# Patient Record
Sex: Male | Born: 1965 | Race: White | Hispanic: No | Marital: Married | Smoking: Former smoker
Health system: Southern US, Community
[De-identification: ages and names within clinical notes are randomized; demographics above are authoritative.]

## PROBLEM LIST (undated history)

## (undated) DIAGNOSIS — F1910 Other psychoactive substance abuse, uncomplicated: Secondary | ICD-10-CM

## (undated) DIAGNOSIS — F1011 Alcohol abuse, in remission: Secondary | ICD-10-CM

## (undated) DIAGNOSIS — R21 Rash and other nonspecific skin eruption: Secondary | ICD-10-CM

## (undated) DIAGNOSIS — F411 Generalized anxiety disorder: Secondary | ICD-10-CM

## (undated) DIAGNOSIS — E785 Hyperlipidemia, unspecified: Secondary | ICD-10-CM

## (undated) DIAGNOSIS — J309 Allergic rhinitis, unspecified: Secondary | ICD-10-CM

## (undated) DIAGNOSIS — K5909 Other constipation: Secondary | ICD-10-CM

## (undated) HISTORY — DX: Rash and other nonspecific skin eruption: R21

## (undated) HISTORY — PX: KNEE SURGERY: SHX244

## (undated) HISTORY — DX: Hyperlipidemia, unspecified: E78.5

## (undated) HISTORY — DX: Generalized anxiety disorder: F41.1

## (undated) HISTORY — DX: Allergic rhinitis, unspecified: J30.9

## (undated) HISTORY — DX: Other constipation: K59.09

## (undated) HISTORY — DX: Alcohol abuse, in remission: F10.11

## (undated) HISTORY — DX: Other psychoactive substance abuse, uncomplicated: F19.10

---

## 2009-10-09 ENCOUNTER — Ambulatory Visit: Payer: Self-pay | Admitting: Internal Medicine

## 2009-10-09 DIAGNOSIS — J309 Allergic rhinitis, unspecified: Secondary | ICD-10-CM

## 2009-10-09 DIAGNOSIS — F411 Generalized anxiety disorder: Secondary | ICD-10-CM

## 2009-10-09 HISTORY — DX: Generalized anxiety disorder: F41.1

## 2009-10-09 HISTORY — DX: Allergic rhinitis, unspecified: J30.9

## 2009-10-09 LAB — CONVERTED CEMR LAB
BUN: 13 mg/dL (ref 6–23)
Basophils Absolute: 0 10*3/uL (ref 0.0–0.1)
Bilirubin Urine: NEGATIVE
Bilirubin, Direct: 0.1 mg/dL (ref 0.0–0.3)
Chloride: 105 meq/L (ref 96–112)
Cholesterol: 209 mg/dL — ABNORMAL HIGH (ref 0–200)
Creatinine, Ser: 0.8 mg/dL (ref 0.4–1.5)
Direct LDL: 148.6 mg/dL
Eosinophils Absolute: 0.1 10*3/uL (ref 0.0–0.7)
Eosinophils Relative: 1.5 % (ref 0.0–5.0)
GFR calc non Af Amer: 112.63 mL/min (ref >60)
Glucose, Bld: 89 mg/dL (ref 70–99)
HCT: 48.4 % (ref 39.0–52.0)
HDL: 42.6 mg/dL (ref >39.00)
Hemoglobin, Urine: NEGATIVE
Ketones, ur: NEGATIVE mg/dL
Lymphs Abs: 2.2 10*3/uL (ref 0.7–4.0)
MCV: 86.6 fL (ref 78.0–100.0)
Monocytes Absolute: 0.6 10*3/uL (ref 0.1–1.0)
Neutrophils Relative %: 53.4 % (ref 43.0–77.0)
Platelets: 228 10*3/uL (ref 150.0–400.0)
Potassium: 4.2 meq/L (ref 3.5–5.1)
RDW: 13.1 % (ref 11.5–14.6)
TSH: 2.29 microintl units/mL (ref 0.35–5.50)
Total Bilirubin: 0.5 mg/dL (ref 0.3–1.2)
Total CHOL/HDL Ratio: 5
Total Protein, Urine: NEGATIVE mg/dL
Triglycerides: 145 mg/dL (ref 0.0–149.0)
Urine Glucose: NEGATIVE mg/dL
Urobilinogen, UA: 0.2 (ref 0.0–1.0)
VLDL: 29 mg/dL (ref 0.0–40.0)
WBC: 6.3 10*3/uL (ref 4.5–10.5)

## 2010-04-30 ENCOUNTER — Ambulatory Visit: Payer: Self-pay | Admitting: Internal Medicine

## 2010-04-30 DIAGNOSIS — K5909 Other constipation: Secondary | ICD-10-CM

## 2010-04-30 DIAGNOSIS — R21 Rash and other nonspecific skin eruption: Secondary | ICD-10-CM

## 2010-04-30 DIAGNOSIS — E785 Hyperlipidemia, unspecified: Secondary | ICD-10-CM

## 2010-04-30 HISTORY — DX: Rash and other nonspecific skin eruption: R21

## 2010-04-30 HISTORY — DX: Hyperlipidemia, unspecified: E78.5

## 2010-04-30 HISTORY — DX: Other constipation: K59.09

## 2010-08-05 NOTE — Assessment & Plan Note (Signed)
Summary: right middle finger pain/constipation/cd   Vital Signs:  Patient profile:   45 year old male Height:      72 inches Weight:      201.38 pounds BMI:     27.41 O2 Sat:      96 % on Room air Temp:     97.5 degrees F oral Pulse rate:   97 / minute BP sitting:   112 / 80  (left arm) Cuff size:   large  Vitals Entered By: Zella Ball Ewing CMA Duncan Dull) (April 30, 2010 1:33 PM)  O2 Flow:  Room air CC: Right middle finger pain, constipation/RE   CC:  Right middle finger pain and constipation/RE.  History of Present Illness: here to f/u; overall doing well, but works with a pressure washer often , without hand protection,and has developed rash with minimal tender but some scaly and becoming more extensive in size to third finger right hand only;  no fever, red streaks, pain, but has been ongoing for over 3 months now.    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  No fever, wt loss, night sweats, loss of appetite or other constitutional symptoms   Problems Prior to Update: 1)  Constipation, Chronic  (ICD-564.09) 2)  Hyperlipidemia  (ICD-272.4) 3)  Rash-nonvesicular  (ICD-782.1) 4)  Preventive Health Care  (ICD-V70.0) 5)  Anxiety  (ICD-300.00) 6)  Allergic Rhinitis  (ICD-477.9)  Medications Prior to Update: 1)  None  Current Medications (verified): 1)  Fluocinonide 0.05 % Crea (Fluocinonide) .... 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   Head:  normocephalic and atraumatic.   Eyes:  vision grossly intact, pupils equal, and pupils round.   Ears:  R ear normal and L ear normal.   Nose:  no external deformity and no nasal discharge.   Mouth:  no gingival abnormalities and pharynx pink and moist.   Neck:  supple and no masses.   Lungs:  normal respiratory effort and normal breath sounds.   Heart:  normal rate and regular rhythm.   Abdomen:  soft, non-tender, and normal bowel sounds.   Extremities:  no edema, no erythema    Impression & Recommendations:  Problem # 1:  RASH-NONVESICULAR (ICD-782.1)  His updated medication list for this problem includes:    Fluocinonide 0.05 % Crea (Fluocinonide) ..... Use asd two times a day as needed treat as above, f/u  any worsening signs or symptoms   Problem # 2:  CONSTIPATION, CHRONIC (ICD-564.09) mild, ongoing without red flags adn exam benign; advised good lifestyle habits as he does, stool softner and/or as needed OTC laxative;  f/u any worsening s/s;  ? IBS type constipation  Problem # 3:  HYPERLIPIDEMIA (ICD-272.4)  Labs Reviewed: SGOT: 30 (10/09/2009)   SGPT: 43 (10/09/2009)   HDL:42.60 (10/09/2009)  Chol:209 (10/09/2009)  Trig:145.0 (10/09/2009) d/w pt - declines statin at this time;  Pt to continue diet efforts, good med tolerance; to check labs - goal LDL less than 100  Complete Medication List: 1)  Fluocinonide 0.05 % Crea (Fluocinonide) .... Use asd two times a day  as needed  Patient Instructions: 1)  please try the colace stool softner 100 mg twice per day as needed , or even OTC senakot per day 2)  please follow lower cholesterol diet 3)  Please take all new medications as prescribed - the cream 4)  Continue all previous medications as before this visit  5)  Please schedule a follow-up appointment in April 2012 with CPX labs Prescriptions: FLUOCINONIDE 0.05 % CREA (FLUOCINONIDE) use asd two times a day as needed  #1 large x 1   Entered and Authorized by:   Corwin Levins MD   Signed by:   Corwin Levins MD on 04/30/2010   Method used:   Print then Give to Patient   RxID:   [id number]    Orders Added: 1)  Est. Patient Level IV [62952]

## 2010-08-05 NOTE — Assessment & Plan Note (Signed)
Summary: NEW/ [employer] /[place]   #   Vital Signs:  Patient profile:   45 year old male Height:      72.5 inches Weight:      207 pounds BMI:     27.79 O2 Sat:      98 % on Room air Temp:     98 degrees F oral Pulse rate:   85 / minute BP sitting:   120 / 70  (left arm) Cuff size:   regular  Vitals Entered ByZella Ball Ewing (October 09, 2009 9:47 AM)  O2 Flow:  Room air  CC: New Pt, New [employer]/RE   CC:  New Pt and New [employer]/RE.  History of Present Illness: just got new insurance;  last saw MD lsat yr, and prior to that 10 yrs;   here with chronic recurring left ear itching for which he has seen  4 MD's in the past yr with mult tx including antihist and topical steriod but nothing helps - eventually saw allergist - had some allergy shots but had some reaction and quit the shots;  peak wt has been 270 in the past; walks 5 miles per day to help control the anxiety  Preventive Screening-Counseling & Management  Alcohol-Tobacco     Smoking Status: quit      Drug Use:  no.    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 several maternal with DM, HTN father  with ETOH grandfather and uncle died with kidney cancer - both at 54yo  Social History: Reviewed history and no changes required. 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         all otherwise negative per pt -    Physical Exam  General:  alert and overweight-appearing.   Head:  normocephalic and atraumatic.   Eyes:  vision grossly intact, pupils equal, and pupils round.   Ears:  R ear normal and L ear normal.   Nose:  no external deformity and no nasal discharge.   Mouth:  no gingival abnormalities and pharynx pink and moist.   Neck:  supple and no masses.   Lungs:  normal respiratory effort and normal breath sounds.   Heart:  normal rate and regular rhythm.   Abdomen:  soft, non-tender, and normal bowel sounds.  Msk:  no joint tenderness and no joint swelling.   Extremities:  no edema, no erythema  Neurologic:  cranial nerves II-XII intact and strength normal in all extremities.     Impression & Recommendations:  Problem # 1:  Preventive Health Care (ICD-V70.0)  Overall doing well, age appropriate education and counseling updated and referral for appropriate preventive services done unless declined, immunizations up to date or declined, diet counseling done if overweight, urged to quit smoking if smokes , most recent labs reviewed and current ordered if appropriate, ecg reviewed or declined (interpretation per ECG scanned in  the EMR if done); information regarding Medicare Prevention requirements given if appropriate   Orders: TLB-BMP (Basic Metabolic Panel-BMET) (80048-METABOL) TLB-CBC Platelet - w/Differential (85025-CBCD) TLB-Hepatic/Liver Function Pnl (80076-HEPATIC) TLB-Lipid Panel (80061-LIPID) TLB-TSH (Thyroid Stimulating Hormone) (84443-TSH) TLB-PSA (Prostate Specific Antigen) (84153-PSA) TLB-Udip ONLY (81003-UDIP)  Patient Instructions: 1)  Please go to the Lab in the basement for your blood and/or urine tests today 2)  Continue all previous medications as before this visit  3)  Please schedule a follow-up appointment in 1 year or sooner if needed   Immunization History:  Tetanus/Td Immunization History:    Tetanus/Td:  historical (09/14/2008)

## 2011-06-18 ENCOUNTER — Ambulatory Visit (INDEPENDENT_AMBULATORY_CARE_PROVIDER_SITE_OTHER): Payer: BC Managed Care – PPO | Admitting: Internal Medicine

## 2011-06-18 ENCOUNTER — Other Ambulatory Visit: Payer: Self-pay | Admitting: Internal Medicine

## 2011-06-18 ENCOUNTER — Other Ambulatory Visit (INDEPENDENT_AMBULATORY_CARE_PROVIDER_SITE_OTHER): Payer: BC Managed Care – PPO

## 2011-06-18 ENCOUNTER — Encounter: Payer: Self-pay | Admitting: Internal Medicine

## 2011-06-18 VITALS — BP 120/90 | HR 69 | Temp 97.8°F | Ht 72.0 in | Wt 206.0 lb

## 2011-06-18 DIAGNOSIS — J309 Allergic rhinitis, unspecified: Secondary | ICD-10-CM

## 2011-06-18 DIAGNOSIS — Z0000 Encounter for general adult medical examination without abnormal findings: Secondary | ICD-10-CM

## 2011-06-18 DIAGNOSIS — G4710 Hypersomnia, unspecified: Secondary | ICD-10-CM

## 2011-06-18 DIAGNOSIS — F411 Generalized anxiety disorder: Secondary | ICD-10-CM

## 2011-06-18 DIAGNOSIS — G4700 Insomnia, unspecified: Secondary | ICD-10-CM

## 2011-06-18 LAB — BASIC METABOLIC PANEL
Calcium: 9.5 mg/dL (ref 8.4–10.5)
Creatinine, Ser: 0.8 mg/dL (ref 0.4–1.5)
GFR: 108.6 mL/min (ref >60.00)

## 2011-06-18 LAB — CBC WITH DIFFERENTIAL/PLATELET
Basophils Absolute: 0 10*3/uL (ref 0.0–0.1)
Basophils Relative: 0.3 % (ref 0.0–3.0)
Eosinophils Absolute: 0.1 10*3/uL (ref 0.0–0.7)
Hemoglobin: 16.1 g/dL (ref 13.0–17.0)
Lymphocytes Relative: 27.7 % (ref 12.0–46.0)
Monocytes Relative: 6.6 % (ref 3.0–12.0)
Neutro Abs: 3.6 10*3/uL (ref 1.4–7.7)
Neutrophils Relative %: 64.4 % (ref 43.0–77.0)
RBC: 5.25 Mil/uL (ref 4.22–5.81)
RDW: 13.4 % (ref 11.5–14.6)

## 2011-06-18 LAB — LIPID PANEL
Cholesterol: 204 mg/dL — ABNORMAL HIGH (ref 0–200)
HDL: 43 mg/dL (ref >39.00)
Total CHOL/HDL Ratio: 5
Triglycerides: 94 mg/dL (ref 0.0–149.0)
VLDL: 18.8 mg/dL (ref 0.0–40.0)

## 2011-06-18 LAB — URINALYSIS, ROUTINE W REFLEX MICROSCOPIC
Hgb urine dipstick: NEGATIVE
Nitrite: NEGATIVE
Specific Gravity, Urine: 1.025 (ref 1.000–1.030)
Urine Glucose: NEGATIVE
Urobilinogen, UA: 0.2 (ref 0.0–1.0)

## 2011-06-18 LAB — PSA: PSA: 0.63 ng/mL (ref 0.10–4.00)

## 2011-06-18 LAB — HEPATIC FUNCTION PANEL
ALT: 28 U/L (ref 0–53)
Albumin: 4.2 g/dL (ref 3.5–5.2)
Total Protein: 7.1 g/dL (ref 6.0–8.3)

## 2011-06-18 MED ORDER — FLUTICASONE PROPIONATE 50 MCG/ACT NA SUSP: 2.0000 | Freq: Every day | NASAL | Status: AC

## 2011-06-18 MED ORDER — ZOLPIDEM TARTRATE 10 MG PO TABS: 10.0000 mg | ORAL_TABLET | Freq: Every evening | ORAL | Status: AC | PRN

## 2011-06-18 NOTE — Progress Notes (Signed)
  Subjective:    Patient ID: Jack Velez, male    DOB: May 19, 1966, 45 y.o.   MRN: [id number]  HPI  Here to f/u and wellness;  Does have several wks ongoing nasal allergy symptoms with clear congestion, itch and sneeze, without fever, pain, ST, cough or wheezing.  Seasonal change makes wore.  Has been "hooked" on afrin in the past.  Has HA sometimes on waking up.  Some sleepy during the day still despite overall wt loss from 286 to 206 since 2007;  Has signficant insomnia with getting to sleep, but also has signficant limb movements with sleep as well.  Pt denies chest pain, increased sob or doe, wheezing, orthopnea, PND, increased LE swelling, palpitations, dizziness or syncope.  Pt denies new neurological symptoms such as new headache, or facial or extremity weakness or numbness   Pt denies polydipsia, polyuria.   Pt denies fever,  night sweats, loss of appetite, or other constitutional symptoms.  Denies worsening depressive symptoms, suicidal ideation, or panic, though has ongoing anxiety. Pt states good ability with ADL's, low fall risk, home safety reviewed and adequate, no significant changes in hearing or vision, and occasionally active with exercise.  Past Medical History  Diagnosis Date  . ALLERGIC RHINITIS 10/09/2009  . ANXIETY 10/09/2009  . CONSTIPATION, CHRONIC 04/30/2010  . HYPERLIPIDEMIA 04/30/2010  . RASH-NONVESICULAR 04/30/2010  . History of alcohol abuse     none since 2001  . Polysubstance abuse     hx of-none since 2001   Past Surgical History  Procedure Date  . Knee surgery     s/p left knee at 45yo    reports that he has quit smoking. He does not have any smokeless tobacco history on file. He reports that he does not drink alcohol or use illicit drugs. family history includes Cancer in his other; Diabetes in his other; and Hypertension in his other. No Known Allergies No current outpatient prescriptions on file prior to visit.   Review of Systems Review of Systems    Constitutional: Negative for diaphoresis and unexpected weight change.  HENT: Negative for drooling and tinnitus.   Eyes: Negative for photophobia and visual disturbance.  Respiratory: Negative for choking and stridor.   Gastrointestinal: Negative for vomiting and blood in stool.  Genitourinary: Negative for hematuria and decreased urine volume.       Objective:   Physical Exam BP 120/90  Pulse 69  Temp(Src) 97.8 F (36.6 C) (Oral)  Ht 6' (1.829 m)  Wt 206 lb (93.441 kg)  BMI 27.94 kg/m2  SpO2 97% Physical Exam  VS noted Constitutional: Pt appears well-developed and well-nourished.  HENT: Head: Normocephalic.  Right Ear: External ear normal.  Left Ear: External ear normal.  Bilat tm's mild erythema.  Sinus nontender.  Pharynx mild erythema Eyes: Conjunctivae and EOM are normal. Pupils are equal, round, and reactive to light.  Neck: Normal range of motion. Neck supple.  Cardiovascular: Normal rate and regular rhythm.   Pulmonary/Chest: Effort normal and breath sounds normal.  Neurological: Pt is alert. No cranial nerve deficit.  Skin: Skin is warm. No erythema.  Psychiatric: Pt behavior is normal. Thought content normal. 1-2+ nervous    Assessment & Plan:

## 2011-06-18 NOTE — Patient Instructions (Signed)
Take all new medications as prescribed Continue all other medications as before You will be contacted regarding the referral for: overnight oximetry, and pulmonary Please go to LAB in the Basement for the blood and/or urine tests to be done today Please call the phone number [phone] (the [employer]Tree System) for results of testing in 2-3 days;  When calling, simply dial the number, and when prompted enter the MRN number above (the Medical Record Number) and the # key, then the message should start. Please return in 1 year for your yearly visit, or sooner if needed, with Lab testing done 3-5 days before

## 2011-06-19 LAB — LDL CHOLESTEROL, DIRECT: Direct LDL: 143.5 mg/dL

## 2011-06-21 ENCOUNTER — Encounter: Payer: Self-pay | Admitting: Internal Medicine

## 2011-06-21 NOTE — Assessment & Plan Note (Signed)

## 2011-06-21 NOTE — Assessment & Plan Note (Signed)
stable overall by hx and exam, most recent data reviewed with pt, and pt to continue medical treatment as before  Lab Results  Component Value Date   WBC 5.5 06/18/2011   HGB 16.1 06/18/2011   HCT 45.9 06/18/2011   PLT 215.0 06/18/2011   GLUCOSE 96 06/18/2011   CHOL 204* 06/18/2011   TRIG 94.0 06/18/2011   HDL 43.00 06/18/2011   LDLDIRECT 143.5 06/18/2011   ALT 28 06/18/2011   AST 24 06/18/2011   NA 141 06/18/2011   K 4.5 06/18/2011   CL 106 06/18/2011   CREATININE 0.8 06/18/2011   BUN 15 06/18/2011   CO2 29 06/18/2011   TSH 2.01 06/18/2011   PSA 0.63 06/18/2011

## 2011-06-21 NOTE — Assessment & Plan Note (Signed)
Mild to mod, for flonase asd,  to f/u any worsening symptoms or concerns  

## 2011-06-21 NOTE — Assessment & Plan Note (Signed)
stable overall by hx and exam, and pt to continue medical treatment as before, for ambien qhs prn

## 2011-06-21 NOTE — Assessment & Plan Note (Signed)
?   OSA vs PLMD vs other - for ONO per [facility], and refer pulm,  to f/u any worsening symptoms or concerns

## 2011-07-09 ENCOUNTER — Institutional Professional Consult (permissible substitution): Payer: BC Managed Care – PPO | Admitting: Pulmonary Disease

## 2013-09-29 ENCOUNTER — Other Ambulatory Visit: Payer: Self-pay | Admitting: Gastroenterology

## 2013-09-29 DIAGNOSIS — R1310 Dysphagia, unspecified: Secondary | ICD-10-CM

## 2013-10-02 ENCOUNTER — Ambulatory Visit
Admission: RE | Admit: 2013-10-02 | Discharge: 2013-10-02 | Disposition: A | Discharge status: Home / Self Care | Payer: 59 | Source: Ambulatory Visit | Attending: Gastroenterology | Admitting: Gastroenterology

## 2013-10-02 DIAGNOSIS — R1310 Dysphagia, unspecified: Secondary | ICD-10-CM

## 2014-12-18 IMAGING — RF DG ESOPHAGUS
11 of 19 series · 12 of 24 positions shown · non-contrast
Comparison: None.

CLINICAL DATA: Dysphagia

EXAM:
ESOPHOGRAM / BARIUM SWALLOW / BARIUM TABLET STUDY
TECHNIQUE: Combined double contrast and single contrast examination performed
using effervescent crystals, thick barium liquid, and thin barium
liquid. The patient was observed with fluoroscopy swallowing a 13mm
barium sulphate tablet.
FLUOROSCOPY TIME:  1 min, 48 seconds

[Series 1: run · 1 of 7 slices shown (1 of 11)]
[im 4/7]
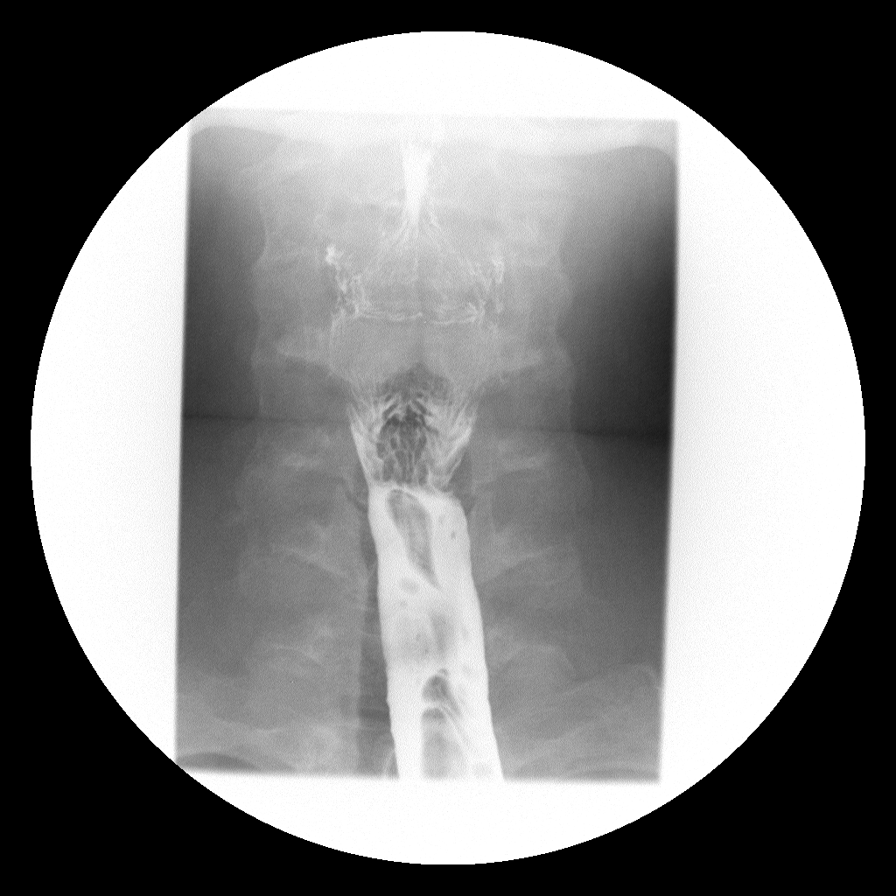

[Series 2: run · 2 of 7 slices shown (2 of 11)]
[im 1/7]
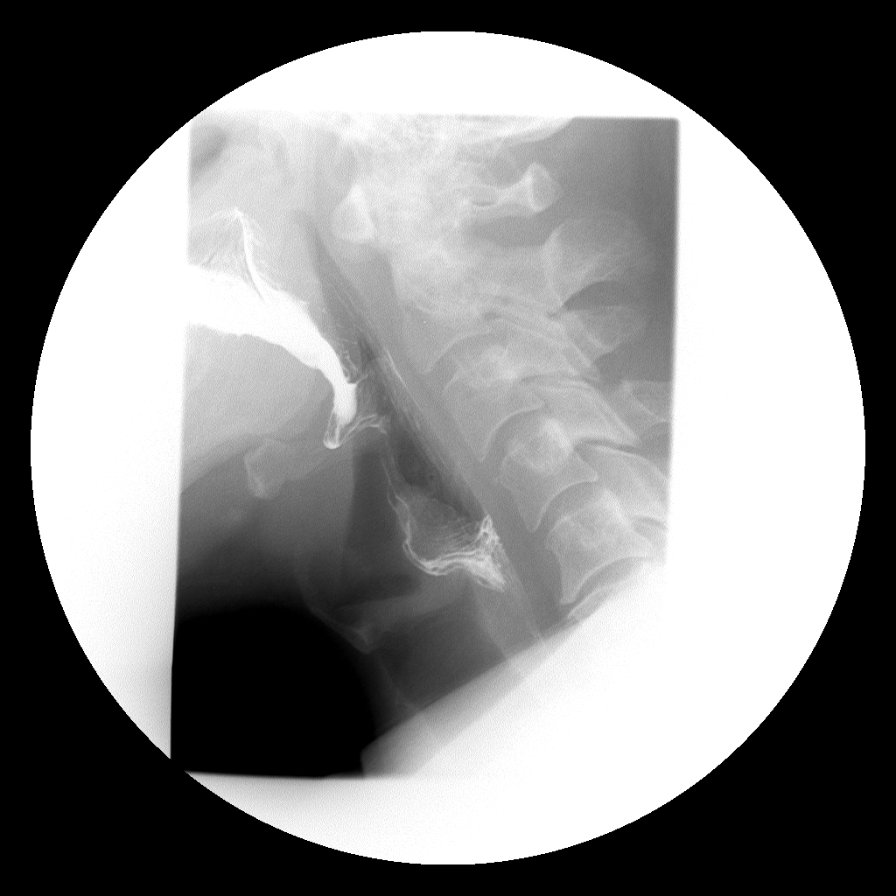
[im 5/7]
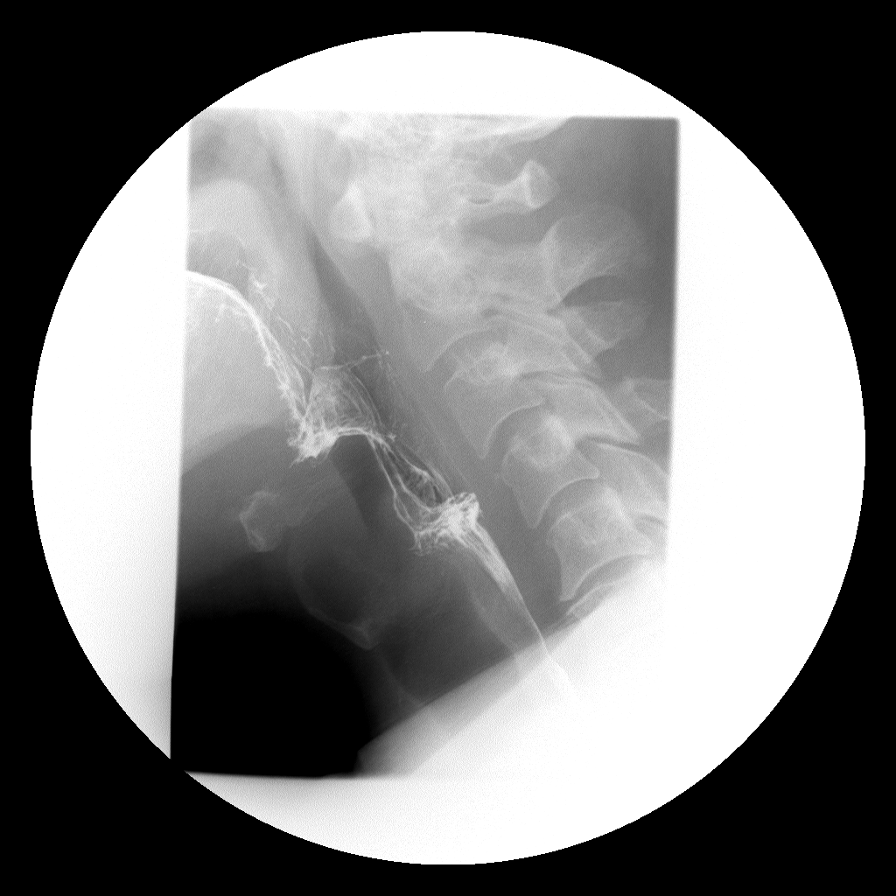

[Series 3: run · 1 of 1 slices shown (3 of 11)]
[im 1/1]
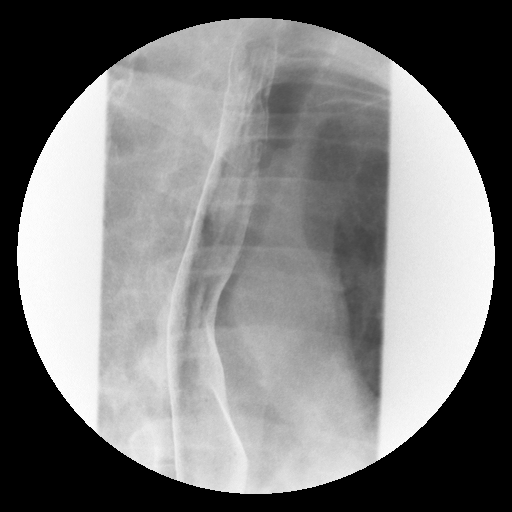

[Series 5: run · 1 of 1 slices shown (4 of 11)]
[im 1/1]
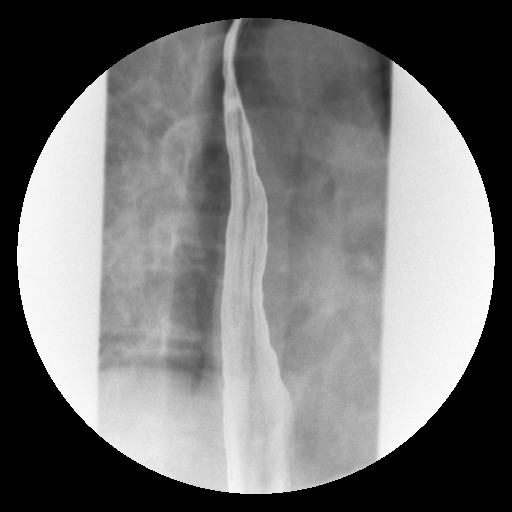

[Series 7: run · 1 of 1 slices shown (5 of 11)]
[im 1/1]
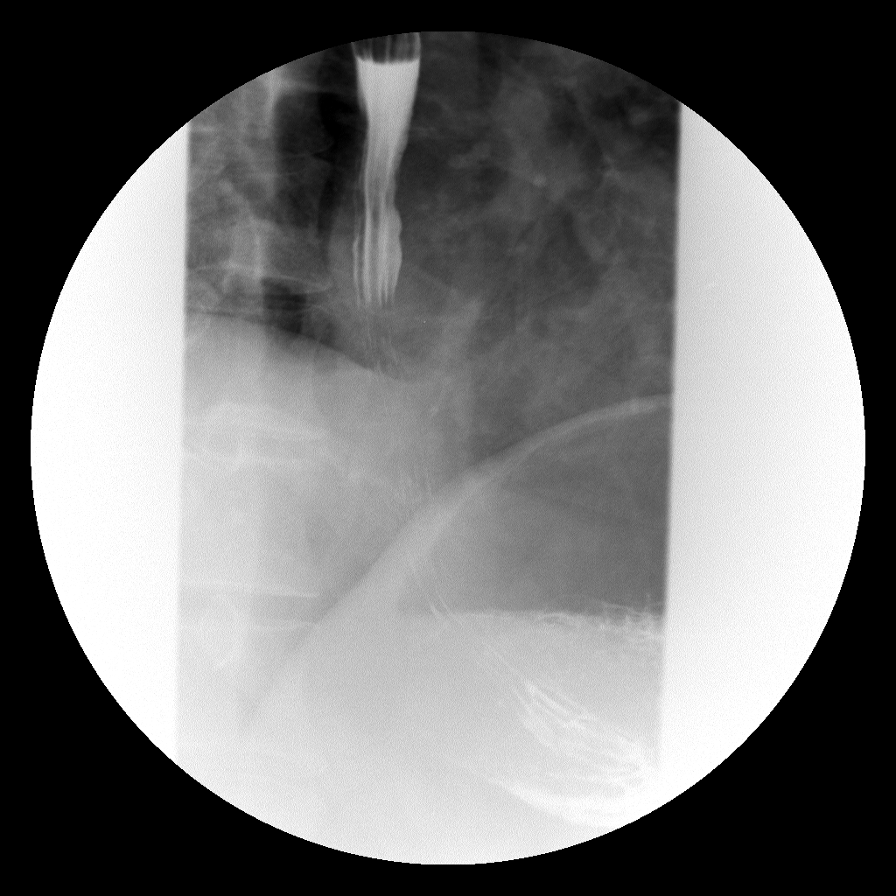

[Series 9: run · 1 of 1 slices shown (6 of 11)]
[im 1/1]
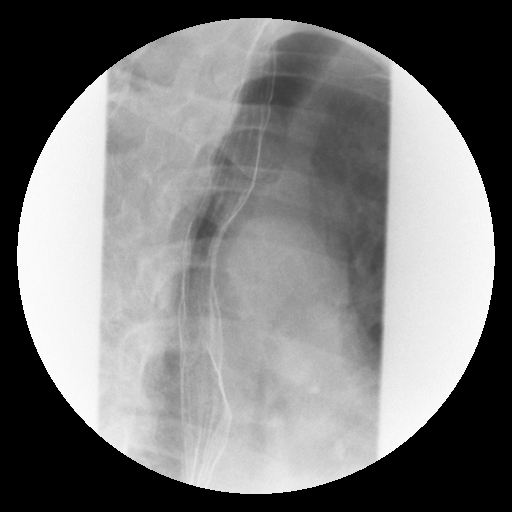

[Series 11: run · 1 of 1 slices shown (7 of 11)]
[im 1/1]
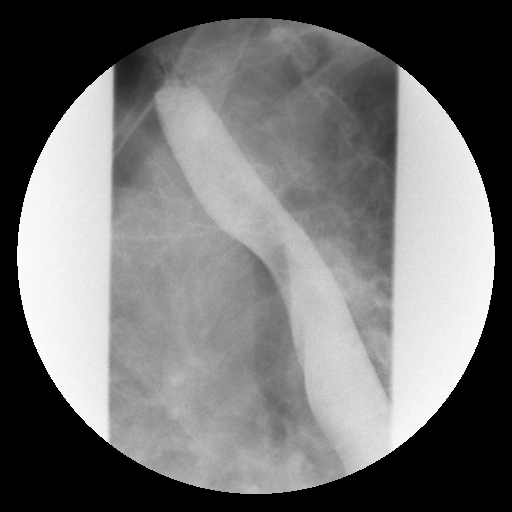

[Series 13: run · 1 of 1 slices shown (8 of 11)]
[im 1/1]
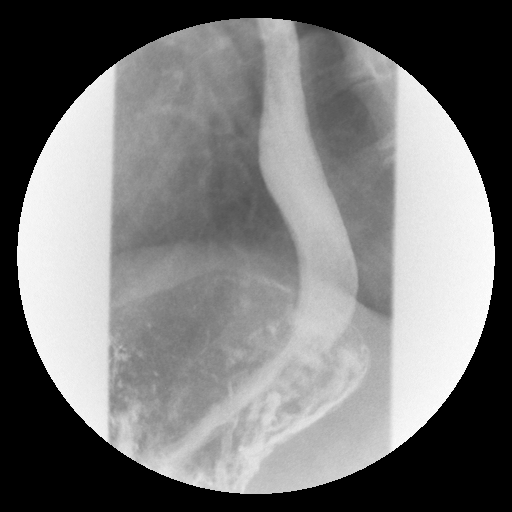

[Series 15: run · 1 of 1 slices shown (9 of 11)]
[im 1/1]
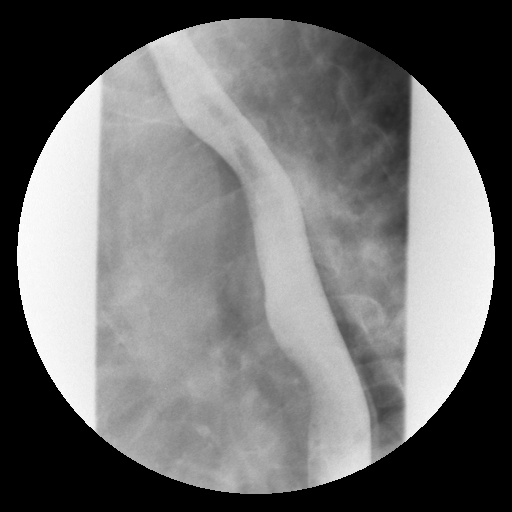

[Series 17: run · 1 of 1 slices shown (10 of 11)]
[im 1/1]
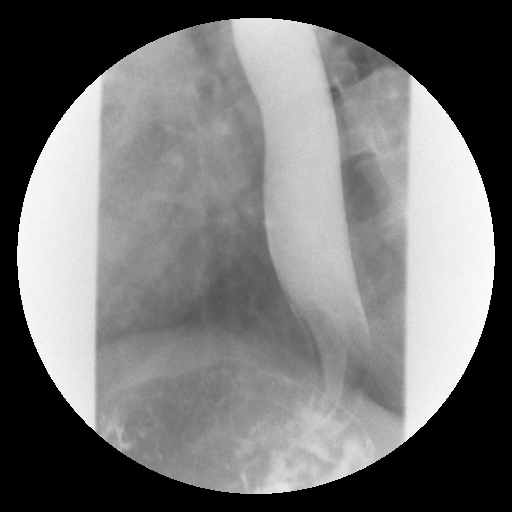

[Series 19: run · 1 of 1 slices shown (11 of 11)]
[im 1/1]
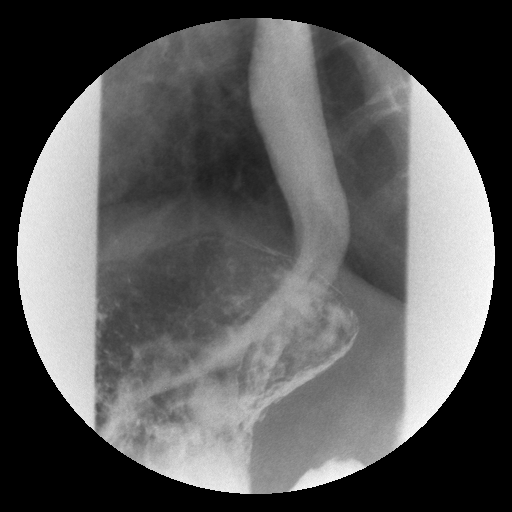

[12 of 24 positions shown; findings below may reference images not displayed]

FINDINGS: The pharyngeal phase of swallowing is normal.

Primary peristaltic waves in the esophagus were disrupted on [DATE] swallows. No esophageal stricture or significant fold
thickening. No esophageal ulceration is observed. No hiatal hernia.

A 13 mm barium tablet passed without difficulty into the stomach.

A single episode of gastroesophageal reflux was observed during the
exam.
IMPRESSION: 1. Gastroesophageal reflux.
2. Primary peristaltic waves were disrupted in the esophagus on [DATE] swallows, suggesting mild nonspecific esophageal
dysmotility disorder.
